# Patient Record
Sex: Female | Born: 1965 | Race: White | Hispanic: No | Marital: Married | Smoking: Never smoker
Health system: Southern US, Community
[De-identification: ages and names within clinical notes are randomized; demographics above are authoritative.]

## PROBLEM LIST (undated history)

## (undated) DIAGNOSIS — Z889 Allergy status to unspecified drugs, medicaments and biological substances status: Secondary | ICD-10-CM

## (undated) HISTORY — PX: OTHER SURGICAL HISTORY: SHX169

## (undated) HISTORY — PX: WISDOM TOOTH EXTRACTION: SHX21

---

## 1998-11-26 ENCOUNTER — Other Ambulatory Visit: Admission: RE | Admit: 1998-11-26 | Discharge: 1998-11-26 | Payer: Self-pay | Admitting: Obstetrics and Gynecology

## 2004-06-15 ENCOUNTER — Ambulatory Visit: Payer: Self-pay | Admitting: Family Medicine

## 2004-09-13 ENCOUNTER — Ambulatory Visit: Payer: Self-pay | Admitting: Family Medicine

## 2004-09-14 ENCOUNTER — Encounter: Admission: RE | Admit: 2004-09-14 | Discharge: 2004-09-14 | Payer: Self-pay | Admitting: Family Medicine

## 2006-03-09 IMAGING — CR DG ELBOW COMPLETE 3+V*L*
2 series · 2 of 2 positions shown · non-contrast
Comparison: none

CLINICAL DATA: Elbow injury.  Pain and swelling medially.
 LEFT ELBOW COMPLETE ? FOUR VIEWS:
 No evidence of joint effusion.  No sign of fracture, dislocation, or degenerative change.

[view not recorded (1 of 2)]
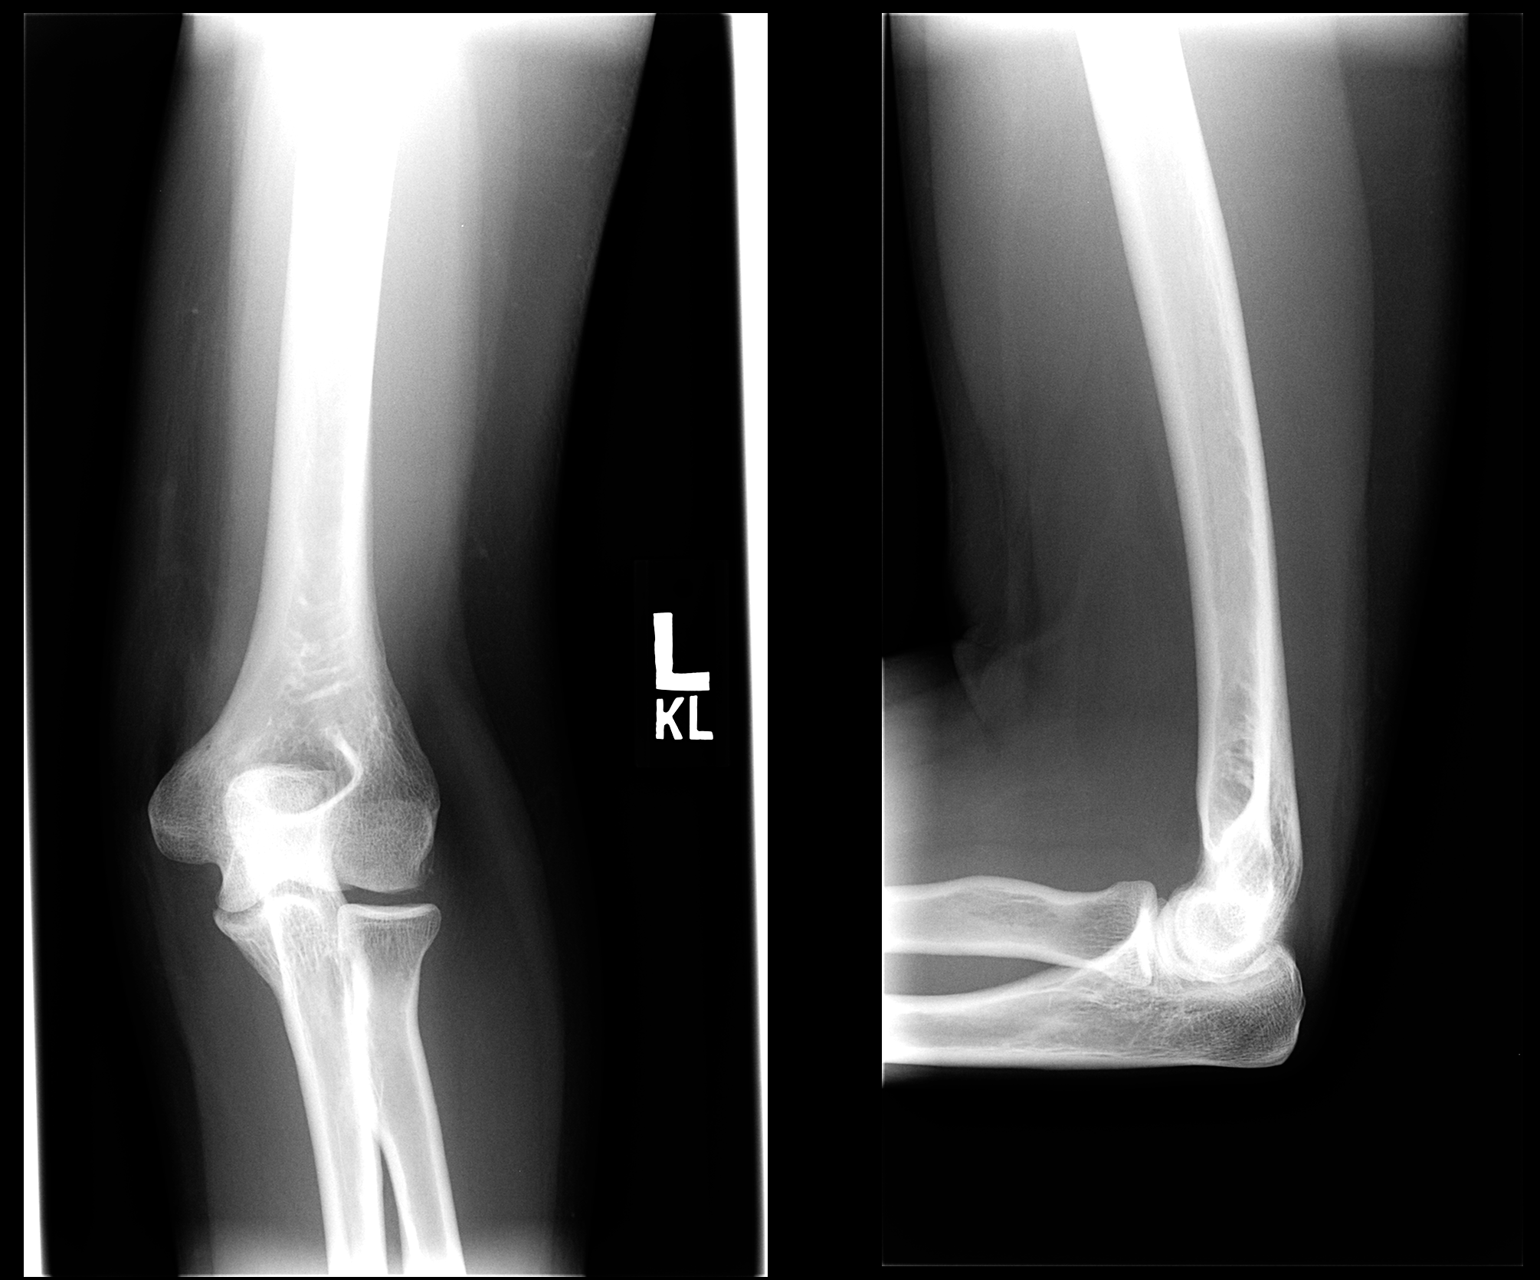

[view not recorded (2 of 2)]
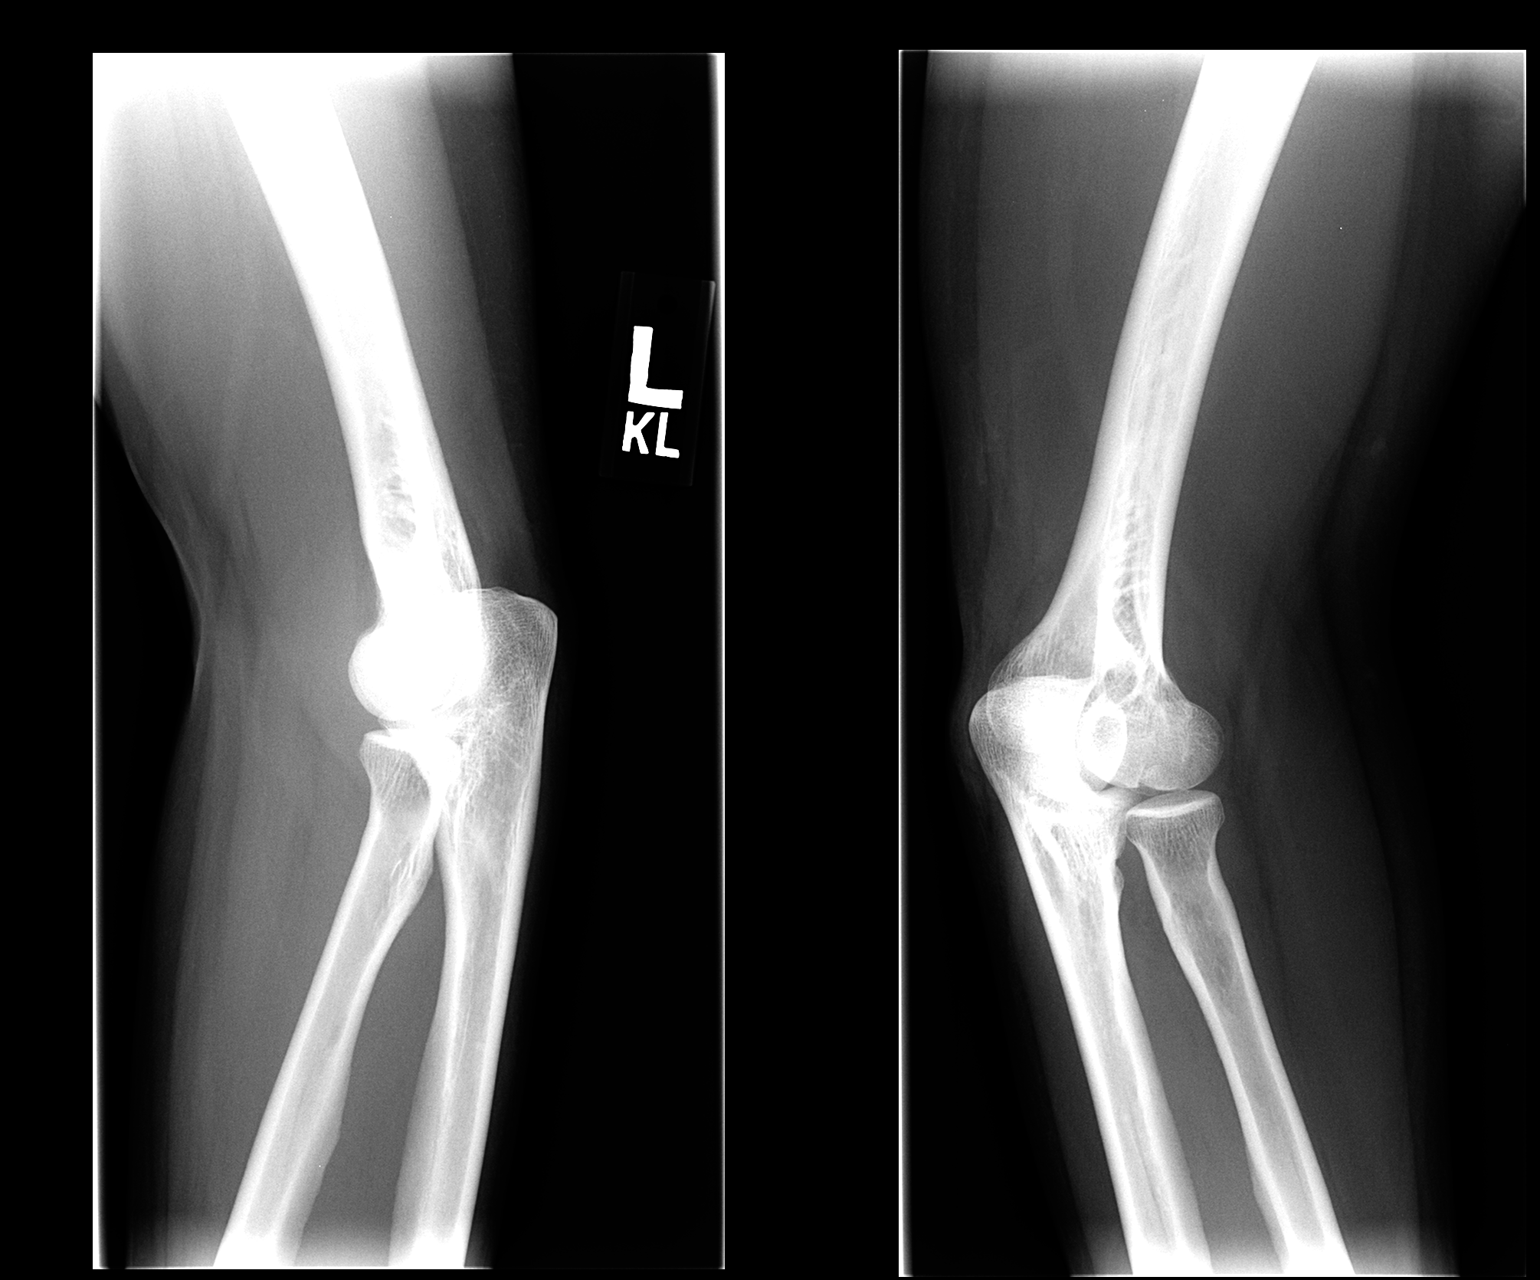

[2 of 2 positions shown; findings below may reference images not displayed]

IMPRESSION: Normal radiographs.

## 2015-04-02 ENCOUNTER — Other Ambulatory Visit: Payer: Self-pay | Admitting: Obstetrics and Gynecology

## 2015-04-06 ENCOUNTER — Encounter (HOSPITAL_COMMUNITY): Payer: Self-pay | Admitting: *Deleted

## 2015-04-11 NOTE — H&P (Signed)
NAMETOMEIKA, WEINMANN NO.:  [id number]  MEDICAL RECORD NO.:  [id number]  LOCATION:  [facility]                         FACILITY:  [facility]  PHYSICIAN:  Lenoard Aden, M.D.DATE OF BIRTH:  12/23/1965  DATE OF ADMISSION:  03/31/2015 DATE OF DISCHARGE:                             HISTORY & PHYSICAL   CHIEF COMPLAINT:  Postmenopausal bleeding and endometrial biopsy C/w endo hyperplasia  HISTORY OF PRESENT ILLNESS:  This is a 49 year old white female, G3, P3, with aforementioned indication for surgery.  ALLERGIES:  No known drug allergies.  MEDICATIONS:  none.  PAST SURGICAL HISTORY:  History of C-section x1, VD x2, knee surgery x1.  FAMILY HISTORY:  Heart disease   PHYSICAL EXAMINATION:  GENERAL:  This is a well-developed, well- nourished, white female, in no acute distress. HEENT:  Normal. NECK:  Supple.  Full range of motion. LUNGS:  Clear. HEART:  Regular rate and rhythm. ABDOMEN:  Soft, nontender. PELVIC:  Reveals an anteflexed uterus and no adnexal masses. EXTREMITIES:  There are no cords. NEUROLOGIC:  Nonfocal. SKIN:  Intact.  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  PLAN:  Proceed with diagnostic hysteroscopy, D and C, possible risks of anesthesia, infection, bleeding, injury to surrounding organs, possible need for repair was discussed.  Delayed versus immediate complications to include bowel and bladder injury noted.  The patient acknowledges and wishes to proceed.     Lenoard Aden, M.D.     RJT/MEDQ  D:  04/11/2015  T:  04/11/2015  Job:  [id number]

## 2015-04-12 ENCOUNTER — Ambulatory Visit (HOSPITAL_COMMUNITY): Payer: Managed Care, Other (non HMO) | Admitting: Anesthesiology

## 2015-04-12 ENCOUNTER — Ambulatory Visit (HOSPITAL_COMMUNITY)
Admission: RE | Admit: 2015-04-12 | Discharge: 2015-04-12 | Disposition: A | Discharge status: Home / Self Care | Payer: Managed Care, Other (non HMO) | Source: Ambulatory Visit | Attending: Obstetrics and Gynecology | Admitting: Obstetrics and Gynecology

## 2015-04-12 ENCOUNTER — Encounter (HOSPITAL_COMMUNITY): Payer: Self-pay | Admitting: Anesthesiology

## 2015-04-12 ENCOUNTER — Encounter (HOSPITAL_COMMUNITY)
Admission: RE | Disposition: A | Discharge status: Home / Self Care | Payer: Self-pay | Source: Ambulatory Visit | Attending: Obstetrics and Gynecology

## 2015-04-12 DIAGNOSIS — N8500 Endometrial hyperplasia, unspecified: Secondary | ICD-10-CM | POA: Insufficient documentation

## 2015-04-12 DIAGNOSIS — N950 Postmenopausal bleeding: Secondary | ICD-10-CM | POA: Insufficient documentation

## 2015-04-12 HISTORY — PX: DILATATION & CURRETTAGE/HYSTEROSCOPY WITH RESECTOCOPE: SHX5572

## 2015-04-12 HISTORY — DX: Allergy status to unspecified drugs, medicaments and biological substances: Z88.9

## 2015-04-12 LAB — CBC
HCT: 39.4 % (ref 36.0–46.0)
Hemoglobin: 13.4 g/dL (ref 12.0–15.0)
MCH: 28.9 pg (ref 26.0–34.0)
MCHC: 34 g/dL (ref 30.0–36.0)
MCV: 85.1 fL (ref 78.0–100.0)
PLATELETS: 189 10*3/uL (ref 150–400)
RBC: 4.63 MIL/uL (ref 3.87–5.11)
RDW: 13.5 % (ref 11.5–15.5)
WBC: 5.9 10*3/uL (ref 4.0–10.5)

## 2015-04-12 LAB — HCG, SERUM, QUALITATIVE: PREG SERUM: NEGATIVE

## 2015-04-12 LAB — TYPE AND SCREEN
ABO/RH(D): B NEG
Antibody Screen: NEGATIVE

## 2015-04-12 LAB — BASIC METABOLIC PANEL
Anion gap: 6 (ref 5–15)
BUN: 14 mg/dL (ref 6–20)
CALCIUM: 8.6 mg/dL — AB (ref 8.9–10.3)
CO2: 29 mmol/L (ref 22–32)
CREATININE: 0.91 mg/dL (ref 0.44–1.00)
Chloride: 105 mmol/L (ref 101–111)
Glucose, Bld: 104 mg/dL — ABNORMAL HIGH (ref 65–99)
Potassium: 4.2 mmol/L (ref 3.5–5.1)
SODIUM: 140 mmol/L (ref 135–145)

## 2015-04-12 LAB — ABO/RH: ABO/RH(D): B NEG

## 2015-04-12 SURGERY — DILATATION & CURETTAGE/HYSTEROSCOPY WITH RESECTOCOPE
Anesthesia: General | Site: Vagina

## 2015-04-12 MED ORDER — VASOPRESSIN 20 UNIT/ML IV SOLN
INTRAVENOUS | Status: DC | PRN
  Administered 2015-04-12: 20 [IU]

## 2015-04-12 MED ORDER — KETOROLAC TROMETHAMINE 30 MG/ML IJ SOLN
INTRAMUSCULAR | Status: AC
  Filled 2015-04-12: qty 1

## 2015-04-12 MED ORDER — CHLOROPROCAINE HCL 1 % IJ SOLN
INTRAMUSCULAR | Status: DC | PRN
  Administered 2015-04-12: 20 mL

## 2015-04-12 MED ORDER — MEPERIDINE HCL 25 MG/ML IJ SOLN: 6.2500 mg | INTRAMUSCULAR | Status: DC | PRN

## 2015-04-12 MED ORDER — PROPOFOL 10 MG/ML IV BOLUS
INTRAVENOUS | Status: AC
  Filled 2015-04-12: qty 20

## 2015-04-12 MED ORDER — SCOPOLAMINE 1 MG/3DAYS TD PT72
1.0000 | MEDICATED_PATCH | Freq: Once | TRANSDERMAL | Status: DC
  Administered 2015-04-12: 1.5 mg via TRANSDERMAL

## 2015-04-12 MED ORDER — CEFAZOLIN SODIUM-DEXTROSE 2-3 GM-% IV SOLR
2.0000 g | INTRAVENOUS | Status: AC
Start: 2015-04-12 — End: 2015-04-12
  Administered 2015-04-12: 2 g via INTRAVENOUS

## 2015-04-12 MED ORDER — FENTANYL CITRATE (PF) 100 MCG/2ML IJ SOLN
INTRAMUSCULAR | Status: AC
  Filled 2015-04-12: qty 4

## 2015-04-12 MED ORDER — ONDANSETRON HCL 4 MG/2ML IJ SOLN: 4.0000 mg | Freq: Once | INTRAMUSCULAR | Status: DC | PRN

## 2015-04-12 MED ORDER — FENTANYL CITRATE (PF) 100 MCG/2ML IJ SOLN
INTRAMUSCULAR | Status: DC | PRN
  Administered 2015-04-12 (×2): 50 ug via INTRAVENOUS

## 2015-04-12 MED ORDER — SODIUM CHLORIDE 0.9 % IJ SOLN
INTRAMUSCULAR | Status: AC
  Filled 2015-04-12: qty 50

## 2015-04-12 MED ORDER — DEXAMETHASONE SODIUM PHOSPHATE 4 MG/ML IJ SOLN
INTRAMUSCULAR | Status: DC | PRN
  Administered 2015-04-12: 4 mg via INTRAVENOUS

## 2015-04-12 MED ORDER — ONDANSETRON HCL 4 MG/2ML IJ SOLN
INTRAMUSCULAR | Status: DC | PRN
  Administered 2015-04-12: 4 mg via INTRAVENOUS

## 2015-04-12 MED ORDER — PROPOFOL 10 MG/ML IV BOLUS
INTRAVENOUS | Status: DC | PRN
  Administered 2015-04-12: 200 mg via INTRAVENOUS

## 2015-04-12 MED ORDER — ONDANSETRON HCL 4 MG/2ML IJ SOLN
INTRAMUSCULAR | Status: AC
  Filled 2015-04-12: qty 2

## 2015-04-12 MED ORDER — MIDAZOLAM HCL 5 MG/5ML IJ SOLN
INTRAMUSCULAR | Status: DC | PRN
  Administered 2015-04-12: 2 mg via INTRAVENOUS

## 2015-04-12 MED ORDER — HYDROCODONE-IBUPROFEN 7.5-200 MG PO TABS: 1.0000 | ORAL_TABLET | Freq: Three times a day (TID) | ORAL | Status: AC | PRN

## 2015-04-12 MED ORDER — LIDOCAINE HCL (CARDIAC) 20 MG/ML IV SOLN
INTRAVENOUS | Status: DC | PRN
  Administered 2015-04-12: 100 mg via INTRAVENOUS

## 2015-04-12 MED ORDER — ACETAMINOPHEN 325 MG PO TABS: 325.0000 mg | ORAL_TABLET | ORAL | Status: DC | PRN

## 2015-04-12 MED ORDER — LIDOCAINE HCL (CARDIAC) 20 MG/ML IV SOLN
INTRAVENOUS | Status: AC
  Filled 2015-04-12: qty 5

## 2015-04-12 MED ORDER — CHLOROPROCAINE HCL 1 % IJ SOLN
INTRAMUSCULAR | Status: AC
  Filled 2015-04-12: qty 30

## 2015-04-12 MED ORDER — ACETAMINOPHEN 160 MG/5ML PO SOLN: 325.0000 mg | ORAL | Status: DC | PRN

## 2015-04-12 MED ORDER — KETOROLAC TROMETHAMINE 30 MG/ML IJ SOLN
INTRAMUSCULAR | Status: DC | PRN
  Administered 2015-04-12: 30 mg via INTRAVENOUS

## 2015-04-12 MED ORDER — CEFAZOLIN SODIUM-DEXTROSE 2-3 GM-% IV SOLR
INTRAVENOUS | Status: AC
  Filled 2015-04-12: qty 50

## 2015-04-12 MED ORDER — SCOPOLAMINE 1 MG/3DAYS TD PT72
MEDICATED_PATCH | TRANSDERMAL | Status: AC
  Administered 2015-04-12: 1.5 mg via TRANSDERMAL
  Filled 2015-04-12: qty 1

## 2015-04-12 MED ORDER — MIDAZOLAM HCL 2 MG/2ML IJ SOLN
INTRAMUSCULAR | Status: AC
  Filled 2015-04-12: qty 4

## 2015-04-12 MED ORDER — FENTANYL CITRATE (PF) 100 MCG/2ML IJ SOLN: 25.0000 ug | INTRAMUSCULAR | Status: DC | PRN

## 2015-04-12 MED ORDER — GLYCINE 1.5 % IR SOLN
Status: DC | PRN
  Administered 2015-04-12 (×2): 3000 mL

## 2015-04-12 MED ORDER — OXYCODONE HCL 5 MG/5ML PO SOLN: 5.0000 mg | Freq: Once | ORAL | Status: DC | PRN

## 2015-04-12 MED ORDER — KETOROLAC TROMETHAMINE 30 MG/ML IJ SOLN: 30.0000 mg | Freq: Once | INTRAMUSCULAR | Status: DC | PRN

## 2015-04-12 MED ORDER — LACTATED RINGERS IV SOLN
INTRAVENOUS | Status: DC
  Administered 2015-04-12 (×2): via INTRAVENOUS

## 2015-04-12 MED ORDER — VASOPRESSIN 20 UNIT/ML IV SOLN
INTRAVENOUS | Status: AC
  Filled 2015-04-12: qty 1

## 2015-04-12 MED ORDER — OXYCODONE HCL 5 MG PO TABS: 5.0000 mg | ORAL_TABLET | Freq: Once | ORAL | Status: DC | PRN

## 2015-04-12 MED ORDER — DEXAMETHASONE SODIUM PHOSPHATE 4 MG/ML IJ SOLN
INTRAMUSCULAR | Status: AC
  Filled 2015-04-12: qty 1

## 2015-04-12 SURGICAL SUPPLY — 21 items
ABLATOR ENDOMETRIAL BIPOLAR (ABLATOR) IMPLANT
CANISTER SUCT 3000ML (MISCELLANEOUS) ×3 IMPLANT
CATH ROBINSON RED A/P 16FR (CATHETERS) ×3 IMPLANT
CLOTH BEACON ORANGE TIMEOUT ST (SAFETY) ×3 IMPLANT
CONTAINER PREFILL 10% NBF 60ML (FORM) ×6 IMPLANT
CORD ACTIVE DISPOSABLE (ELECTRODE)
CORD ELECTRO ACTIVE DISP (ELECTRODE) IMPLANT
ELECT LOOP GYNE PRO 24FR (CUTTING LOOP) ×3
ELECT REM PT RETURN 9FT ADLT (ELECTROSURGICAL) ×3
ELECTRODE LOOP GYNE PRO 24FR (CUTTING LOOP) ×1 IMPLANT
ELECTRODE REM PT RTRN 9FT ADLT (ELECTROSURGICAL) ×1 IMPLANT
GLOVE BIO SURGEON STRL SZ7.5 (GLOVE) ×3 IMPLANT
GOWN STRL REUS W/TWL LRG LVL3 (GOWN DISPOSABLE) ×9 IMPLANT
LOOP ANGLED CUTTING 22FR (CUTTING LOOP) IMPLANT
PACK VAGINAL MINOR WOMEN LF (CUSTOM PROCEDURE TRAY) ×3 IMPLANT
PAD OB MATERNITY 4.3X12.25 (PERSONAL CARE ITEMS) ×3 IMPLANT
SYR TB 1ML 25GX5/8 (SYRINGE) ×3 IMPLANT
TOWEL OR 17X24 6PK STRL BLUE (TOWEL DISPOSABLE) ×6 IMPLANT
TUBING AQUILEX INFLOW (TUBING) ×3 IMPLANT
TUBING AQUILEX OUTFLOW (TUBING) ×3 IMPLANT
WATER STERILE IRR 1000ML POUR (IV SOLUTION) ×3 IMPLANT

## 2015-04-12 NOTE — Transfer of Care (Signed)
Immediate Anesthesia Transfer of Care Note  Patient: Janice Fowler  Procedure(s) Performed: Procedure(s): DILATATION & CURETTAGE/HYSTEROSCOPY WITH RESECTOCOPE (N/A)  Patient Location: PACU  Anesthesia Type:General  Level of Consciousness: awake, alert  and oriented  Airway & Oxygen Therapy: Patient Spontanous Breathing and Patient connected to nasal cannula oxygen  Post-op Assessment: Report given to RN and Post -op Vital signs reviewed and stable  Post vital signs: Reviewed and stable  Last Vitals:  Filed Vitals:   04/12/15 1119  BP: 123/58  Pulse: 70  Temp: 36.7 C  Resp: 20    Complications: No apparent anesthesia complications

## 2015-04-12 NOTE — Anesthesia Postprocedure Evaluation (Signed)
  Anesthesia Post-op Note  Patient: Janice Fowler  Procedure(s) Performed: Procedure(s) (LRB): DILATATION & CURETTAGE/HYSTEROSCOPY WITH RESECTOCOPE (N/A)  Patient Location: PACU  Anesthesia Type: General  Level of Consciousness: awake and alert   Airway and Oxygen Therapy: Patient Spontanous Breathing  Post-op Pain: mild  Post-op Assessment: Post-op Vital signs reviewed, Patient's Cardiovascular Status Stable, Respiratory Function Stable, Patent Airway and No signs of Nausea or vomiting  Last Vitals:  Filed Vitals:   04/12/15 1445  BP: 106/47  Pulse: 66  Temp:   Resp: 19    Post-op Vital Signs: stable   Complications: No apparent anesthesia complications

## 2015-04-12 NOTE — Progress Notes (Signed)
Patient ID: Janice Fowler, female   DOB: [birth year], 49 y.o.   MRN: [id number] Patient seen and examined. Consent witnessed and signed. No changes noted. Update completed. CBC    Component Value Date/Time   WBC 5.9 04/12/2015 1120   RBC 4.63 04/12/2015 1120   HGB 13.4 04/12/2015 1120   HCT 39.4 04/12/2015 1120   PLT 189 04/12/2015 1120   MCV 85.1 04/12/2015 1120   MCH 28.9 04/12/2015 1120   MCHC 34.0 04/12/2015 1120   RDW 13.5 04/12/2015 1120

## 2015-04-12 NOTE — Anesthesia Preprocedure Evaluation (Signed)
Anesthesia Evaluation  Patient identified by MRN, date of birth, ID band  Reviewed: Allergy & Precautions, H&P , NPO status , Patient's Chart, lab work & pertinent test results  Airway Mallampati: II  TM Distance: >3 FB Neck ROM: full    Dental no notable dental hx. (+) Teeth Intact   Pulmonary neg pulmonary ROS,    Pulmonary exam normal        Cardiovascular negative cardio ROS Normal cardiovascular exam     Neuro/Psych negative neurological ROS  negative psych ROS   GI/Hepatic negative GI ROS, Neg liver ROS,   Endo/Other  negative endocrine ROS  Renal/GU negative Renal ROS     Musculoskeletal   Abdominal Normal abdominal exam  (+)   Peds  Hematology negative hematology ROS (+)   Anesthesia Other Findings   Reproductive/Obstetrics negative OB ROS                             Anesthesia Physical Anesthesia Plan  ASA: I  Anesthesia Plan: General   Post-op Pain Management:    Induction: Intravenous  Airway Management Planned: LMA  Additional Equipment:   Intra-op Plan:   Post-operative Plan:   Informed Consent: I have reviewed the patients History and Physical, chart, labs and discussed the procedure including the risks, benefits and alternatives for the proposed anesthesia with the patient or authorized representative who has indicated his/her understanding and acceptance.     Plan Discussed with: CRNA and Surgeon  Anesthesia Plan Comments:         Anesthesia Quick Evaluation

## 2015-04-12 NOTE — Op Note (Signed)
04/12/2015  1:54 PM  PATIENT:  Janice Fowler  49 y.o. female  PRE-OPERATIVE DIAGNOSIS:  Postmenopausal Bleeding, Endometrial Hyperplasia  POST-OPERATIVE DIAGNOSIS:  postmenopausal bleeding, endometrial hyperplasia  PROCEDURE:  Procedure(s): DILATATION & CURETTAGE/HYSTEROSCOPY WITH RESECTOCOPE  SURGEON:  Surgeon(s): Olivia Mackie, MD  ASSISTANTS: none   ANESTHESIA:   local and general  ESTIMATED BLOOD LOSS: minimal  DRAINS: none   LOCAL MEDICATIONS USED:  LIDOCAINE  and Amount: 20 ml  SPECIMEN:  Source of Specimen:  EMC, polyp   DISPOSITION OF SPECIMEN:  PATHOLOGY  COUNTS:  YES  DICTATION #: [id number]  PLAN OF CARE: dc home  PATIENT DISPOSITION:  PACU - hemodynamically stable.

## 2015-04-12 NOTE — Discharge Instructions (Signed)
Dilation and Curettage or Vacuum Curettage, Care After  These instructions give you information on caring for yourself after your procedure. Your doctor may also give you more specific instructions. Call your doctor if you have any problems or questions after your procedure.  HOME CARE   Do not drive for 24 hours.   Wait 1 week before doing any activities that wear you out.   Take your temperature 2 times a day for 4 days. Write it down. Tell your doctor if you have a fever.   Do not stand for a long time.   Do not lift, push, or pull anything over 10 pounds (4.5 kilograms).   Limit stair climbing to once or twice a day.   Rest often.   Continue with your usual diet.   Drink enough fluids to keep your pee (urine) clear or pale yellow.   If you have a hard time pooping (constipation), you may:   Take a medicine to help you go poop (laxative) as told by your doctor.   Eat more fruit and bran.   Drink more fluids.   Take showers, not baths, for as long as told by your doctor.   Do not swim or use a hot tub until your doctor says it is okay.   Have someone with you for 1-2 days after the procedure.   Do not douche, use tampons, or have sex (intercourse) for 2 weeks.   Only take medicines as told by your doctor. Do not take aspirin. It can cause bleeding.   Keep all doctor visits.  GET HELP IF:   You have cramps or pain not helped by medicine.   You have new pain in the belly (abdomen).   You have a bad smelling fluid coming from your vagina.   You have a rash.   You have problems with any medicine.  GET HELP RIGHT AWAY IF:    You start to bleed more than a regular period.   You have a fever.   You have chest pain.   You have trouble breathing.   You feel dizzy or feel like passing out (fainting).   You pass out.   You have pain in the tops of your shoulders.   You have vaginal bleeding with or without clumps of blood (blood clots).  MAKE SURE YOU:   Understand these instructions.   Will  watch your condition.   Will get help right away if you are not doing well or get worse.  Document Released: 04/25/2008 Document Revised: 07/22/2013 Document Reviewed: 02/13/2013  ExitCare Patient Information 2015 [employer]. This information is not intended to replace advice given to you by your health care provider. Make sure you discuss any questions you have with your health care provider.

## 2015-04-12 NOTE — Anesthesia Procedure Notes (Signed)
Procedure Name: LMA Insertion Date/Time: 04/12/2015 1:22 PM Performed by: Junious Silk Pre-anesthesia Checklist: Patient identified, Emergency Drugs available, Suction available, Patient being monitored and Timeout performed Patient Re-evaluated:Patient Re-evaluated prior to inductionOxygen Delivery Method: Circle system utilized Preoxygenation: Pre-oxygenation with 100% oxygen Intubation Type: IV induction Ventilation: Mask ventilation without difficulty LMA: LMA inserted LMA Size: 4.0 Number of attempts: 1 Placement Confirmation: positive ETCO2,  CO2 detector and breath sounds checked- equal and bilateral Tube secured with: Tape Dental Injury: Teeth and Oropharynx as per pre-operative assessment

## 2015-04-13 ENCOUNTER — Encounter (HOSPITAL_COMMUNITY): Payer: Self-pay | Admitting: Obstetrics and Gynecology

## 2015-04-13 NOTE — Op Note (Signed)
NAMENAYANA, LENIG NO.:  [id number]  MEDICAL RECORD NO.:  [id number]  LOCATION:  [facility]                          FACILITY:  [facility]  PHYSICIAN:  Lenoard Aden, M.D.DATE OF BIRTH:  08/18/1965  DATE OF PROCEDURE:  04/12/2015 DATE OF DISCHARGE:  04/12/2015                              OPERATIVE REPORT   PREOPERATIVE DIAGNOSIS:  Postmenopausal bleeding with complex hyperplasia and no atypia on EDX.  POSTOPERATIVE DIAGNOSIS:  Postmenopausal bleeding with complex hyperplasia and no atypia on EDX.  PROCEDURE:  Diagnostic hysteroscopy, D and C, resectoscopic polypectomy.  SURGEON:  Lenoard Aden, M.D.  ASSISTANT:  None.  ANESTHESIA:  General, local.  ESTIMATED BLOOD LOSS:  Less than 50 mL.  FLUID DEFICIT:  200 mL.  COMPLICATIONS:  None.  DRAINS:  None.  COUNTS:  Correct.  DISPOSITION:  The patient to recovery room in good condition.  BRIEF OPERATIVE NOTE:  After being apprised of the risks of anesthesia, infection, bleeding, injury to surrounding organs, possible need for repair, delayed versus immediate complications to include bowel and bladder injury, possible need for repair, the patient was brought to the operating room where she was administered general anesthetic without complications.  Prepped and draped in usual sterile fashion. Catheterized until the bladder was empty.  Exam under anesthesia reveals anteflexed uterus and no adnexal masses.  At this time, speculum placed. Dilute paracervical block placed with 1% Nesacaine solution.  Dilute Pitressin solution placed at 3 and 9 o'clock, 20 mL total.  Cervix easily dilated up to a 31 Pratt dilator.  Hysteroscope placed. Visualization reveals some polypoid fragments along the posterior and lateral walls, which were resected using the right angle scope without difficulty. D and C was then performed using sharp curettage in a 4-quadrant method. Revisualization reveals an empty cavity with  bilateral normal tubal ostia.  Fluid deficit was noted.  Procedure was terminated.  The patient tolerated the procedure well, was awakened, and transferred to the recovery room in good condition.     Lenoard Aden, M.D.     RJT/MEDQ  D:  04/12/2015  T:  04/13/2015  Job:  [id number]

## 2015-05-13 ENCOUNTER — Other Ambulatory Visit: Payer: Self-pay | Admitting: Obstetrics and Gynecology
